# Patient Record
Sex: Female | Born: 1940 | Race: White | Hispanic: No | Marital: Married | Smoking: Never smoker
Health system: Southern US, Community
[De-identification: ages and names within clinical notes are randomized; demographics above are authoritative.]

## PROBLEM LIST (undated history)

## (undated) DIAGNOSIS — M810 Age-related osteoporosis without current pathological fracture: Secondary | ICD-10-CM

## (undated) HISTORY — PX: HAND SURGERY: SHX662

---

## 1988-06-23 HISTORY — PX: SHOULDER SURGERY: SHX246

## 2005-12-25 ENCOUNTER — Emergency Department (HOSPITAL_COMMUNITY): Admission: EM | Admit: 2005-12-25 | Discharge: 2005-12-25 | Payer: Self-pay | Admitting: Emergency Medicine

## 2014-06-23 HISTORY — PX: OVARIAN CYST REMOVAL: SHX89

## 2015-01-30 ENCOUNTER — Encounter (HOSPITAL_COMMUNITY): Payer: Self-pay | Admitting: Emergency Medicine

## 2015-01-30 ENCOUNTER — Emergency Department (HOSPITAL_COMMUNITY): Payer: Medicare Other

## 2015-01-30 ENCOUNTER — Emergency Department (HOSPITAL_COMMUNITY)
Admission: EM | Admit: 2015-01-30 | Discharge: 2015-01-30 | Disposition: A | Discharge status: Home / Self Care | Payer: Medicare Other | Attending: Emergency Medicine | Admitting: Emergency Medicine

## 2015-01-30 DIAGNOSIS — X58XXXA Exposure to other specified factors, initial encounter: Secondary | ICD-10-CM | POA: Diagnosis not present

## 2015-01-30 DIAGNOSIS — Y9389 Activity, other specified: Secondary | ICD-10-CM | POA: Diagnosis not present

## 2015-01-30 DIAGNOSIS — Y998 Other external cause status: Secondary | ICD-10-CM | POA: Diagnosis not present

## 2015-01-30 DIAGNOSIS — Y9289 Other specified places as the place of occurrence of the external cause: Secondary | ICD-10-CM | POA: Insufficient documentation

## 2015-01-30 DIAGNOSIS — Z8739 Personal history of other diseases of the musculoskeletal system and connective tissue: Secondary | ICD-10-CM | POA: Diagnosis not present

## 2015-01-30 DIAGNOSIS — S20212A Contusion of left front wall of thorax, initial encounter: Secondary | ICD-10-CM | POA: Diagnosis not present

## 2015-01-30 DIAGNOSIS — S299XXA Unspecified injury of thorax, initial encounter: Secondary | ICD-10-CM | POA: Diagnosis present

## 2015-01-30 HISTORY — DX: Age-related osteoporosis without current pathological fracture: M81.0

## 2015-01-30 NOTE — Discharge Instructions (Signed)
Take ibuprofen or Aleve for pain control. You may also apply ice to areas of injury. Your chest x-ray did not show any broken bone or fracture. Follow-up with your primary care doctor as needed if symptoms persist.  Chest Contusion A chest contusion is a deep bruise on your chest area. Contusions are the result of an injury that caused bleeding under the skin. A chest contusion may involve bruising of the skin, muscles, or ribs. The contusion may turn blue, purple, or yellow. Minor injuries will give you a painless contusion, but more severe contusions may stay painful and swollen for a few weeks. CAUSES  A contusion is usually caused by a blow, trauma, or direct force to an area of the body. SYMPTOMS   Swelling and redness of the injured area.  Discoloration of the injured area.  Tenderness and soreness of the injured area.  Pain. DIAGNOSIS  The diagnosis can be made by taking a history and performing a physical exam. An X-ray, CT scan, or MRI may be needed to determine if there were any associated injuries, such as broken bones (fractures) or internal injuries. TREATMENT  Often, the best treatment for a chest contusion is resting, icing, and applying cold compresses to the injured area. Deep breathing exercises may be recommended to reduce the risk of pneumonia. Over-the-counter medicines may also be recommended for pain control. HOME CARE INSTRUCTIONS   Put ice on the injured area.  Put ice in a plastic bag.  Place a towel between your skin and the bag.  Leave the ice on for 15-20 minutes, 03-04 times a day.  Only take over-the-counter or prescription medicines as directed by your caregiver. Your caregiver may recommend avoiding anti-inflammatory medicines (aspirin, ibuprofen, and naproxen) for 48 hours because these medicines may increase bruising.  Rest the injured area.  Perform deep-breathing exercises as directed by your caregiver.  Stop smoking if you smoke.  Do not lift  objects over 5 pounds (2.3 kg) for 3 days or longer if recommended by your caregiver. SEEK IMMEDIATE MEDICAL CARE IF:   You have increased bruising or swelling.  You have pain that is getting worse.  You have difficulty breathing.  You have dizziness, weakness, or fainting.  You have blood in your urine or stool.  You cough up or vomit blood.  Your swelling or pain is not relieved with medicines. MAKE SURE YOU:   Understand these instructions.  Will watch your condition.  Will get help right away if you are not doing well or get worse. Document Released: 03/04/2001 Document Revised: 03/03/2012 Document Reviewed: 12/01/2011 [facility] Patient Information 2015 [place], [place]. This information is not intended to replace advice given to you by your health care provider. Make sure you discuss any questions you have with your health care provider.

## 2015-01-30 NOTE — ED Notes (Signed)
Pt c/o left rib pain. She was on top of the washing machine trying to turn off the water, and her side kept pushing against the washer, has been in pain since. Pt has hx of osteoporosis. Pt visiting from [place].

## 2015-01-30 NOTE — ED Provider Notes (Signed)
  Face-to-face evaluation   History: Fall, 2 days ago. Persistent but improving pain, left anterior chest wall.  Physical exam: Alert, calm, cooperative. No respiratory distress. Normal range of motion, Chest and back.  Medical screening examination/treatment/procedure(s) were conducted as a shared visit with non-physician practitioner(s) and myself.  I personally evaluated the patient during the encounter  Mancel Bale, MD 01/31/15 1242

## 2015-01-30 NOTE — ED Provider Notes (Signed)
CSN: [id number]     Arrival date & time 01/30/15  1744 History   First MD Initiated Contact with Patient 01/30/15 1831     Chief Complaint  Patient presents with  . Rib Injury    (Consider location/radiation/quality/duration/timing/severity/associated sxs/prior Treatment) Patient is a 74 y.o. female presenting with chest pain. The history is provided by the patient. No language interpreter was used.  Chest Pain Pain location:  L lateral chest Pain quality: sharp   Pain radiates to:  Does not radiate Pain radiates to the back: no   Pain severity:  Mild Onset quality:  Sudden Duration:  2 days Timing:  Intermittent Progression:  Waxing and waning Chronicity:  New Context: trauma   Context comment:  Patient on top of a washing machine yesterday; reaching over to try and turn on a water valve Relieved by:  Nothing Worsened by:  Coughing, movement and deep breathing Ineffective treatments:  None tried Associated symptoms: no back pain, no dizziness, no shortness of breath, no syncope and no weakness   Risk factors comment:  +osteoporosis   Past Medical History  Diagnosis Date  . Osteoporosis    Past Surgical History  Procedure Laterality Date  . Hand surgery    . Shoulder surgery  1990  . Ovarian cyst removal  2016   No family history on file. History  Substance Use Topics  . Smoking status: Never Smoker   . Smokeless tobacco: Not on file  . Alcohol Use: No   OB History    No data available      Review of Systems  Respiratory: Negative for shortness of breath.   Cardiovascular: Positive for chest pain. Negative for syncope.  Musculoskeletal: Negative for back pain.  Neurological: Negative for dizziness and weakness.  All other systems reviewed and are negative.   Allergies  Review of patient's allergies indicates no known allergies.  Home Medications   Prior to Admission medications   Not on File   BP 143/62 mmHg  Pulse 82  Temp(Src) 97.9 F (36.6 C)  (Oral)  Resp 18  SpO2 98%   Physical Exam  Constitutional: She is oriented to person, place, and time. She appears well-developed and well-nourished. No distress.  Nontoxic/nonseptic appearing. Patient pleasant.  HENT:  Head: Normocephalic and atraumatic.  Eyes: Conjunctivae and EOM are normal. No scleral icterus.  Neck: Normal range of motion.  Cardiovascular: Normal rate, regular rhythm and intact distal pulses.   Pulmonary/Chest: Effort normal. No respiratory distress. She has no wheezes. She has no rales. She exhibits tenderness.  Tenderness to palpation to the left lateral chest wall, along the left anterior axillary line. No crepitus or deformity. Lungs clear to auscultation bilaterally.  Musculoskeletal: Normal range of motion.  Neurological: She is alert and oriented to person, place, and time. She exhibits normal muscle tone. Coordination normal.  Skin: Skin is warm and dry. No rash noted. She is not diaphoretic. No erythema. No pallor.  Psychiatric: She has a normal mood and affect. Her behavior is normal.  Nursing note and vitals reviewed.   ED Course  Procedures (including critical care time) Labs Review Labs Reviewed - No data to display  Imaging Review Dg Ribs Unilateral W/chest Left  01/30/2015   CLINICAL DATA:  Left rib pain  EXAM: LEFT RIBS AND CHEST - 3+ VIEW  COMPARISON:  12/25/2005  FINDINGS: Five views left ribs submitted. No acute infiltrate or pulmonary edema. No left rib fracture. No pneumothorax.  IMPRESSION: Negative.   Electronically Signed  By: Natasha Mead M.D.   On: 01/30/2015 19:20     EKG Interpretation None      MDM   Final diagnoses:  Chest wall contusion, left, initial encounter    74 year old female with symptoms consistent with a contusion to the left lateral chest wall. X-ray negative for rib fracture or pneumothorax. Pain is reproducible on palpation. No crepitus or deformity noted. Will manage with ice and NSAIDs. Patient advised to  follow-up with her primary care doctor should symptoms persist. Return precautions discussed and provided. Patient discharged in good condition with no unaddressed concerns.   Filed Vitals:   01/30/15 1751 01/30/15 1803  BP: 143/62   Pulse: 82   Temp:  97.9 F (36.6 C)  TempSrc:  Oral  Resp: 18   SpO2: 98%      Antony Madura, PA-C 01/30/15 1936  Mancel Bale, MD 01/31/15 1242

## 2017-02-22 IMAGING — CR DG RIBS W/ CHEST 3+V*L*
5 series · 5 of 5 positions shown · non-contrast
Comparison: 12/25/2005

CLINICAL DATA: Left rib pain

EXAM:
LEFT RIBS AND CHEST - 3+ VIEW

[w chest pa]
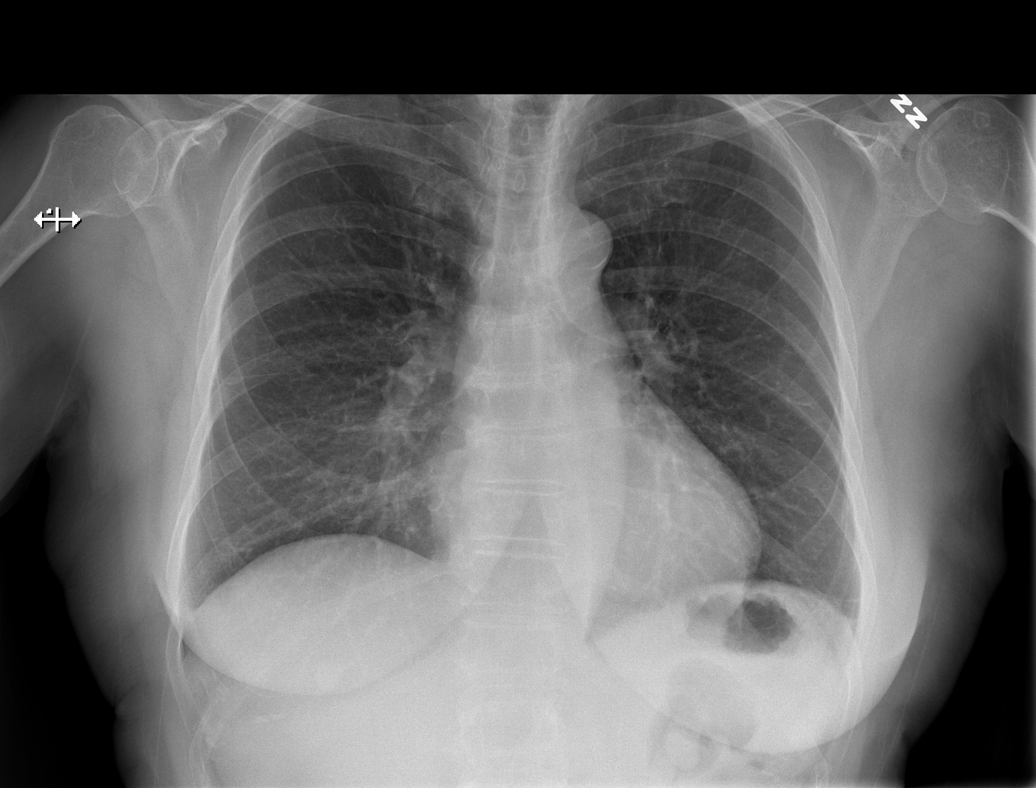

[w ribs ap upper left]
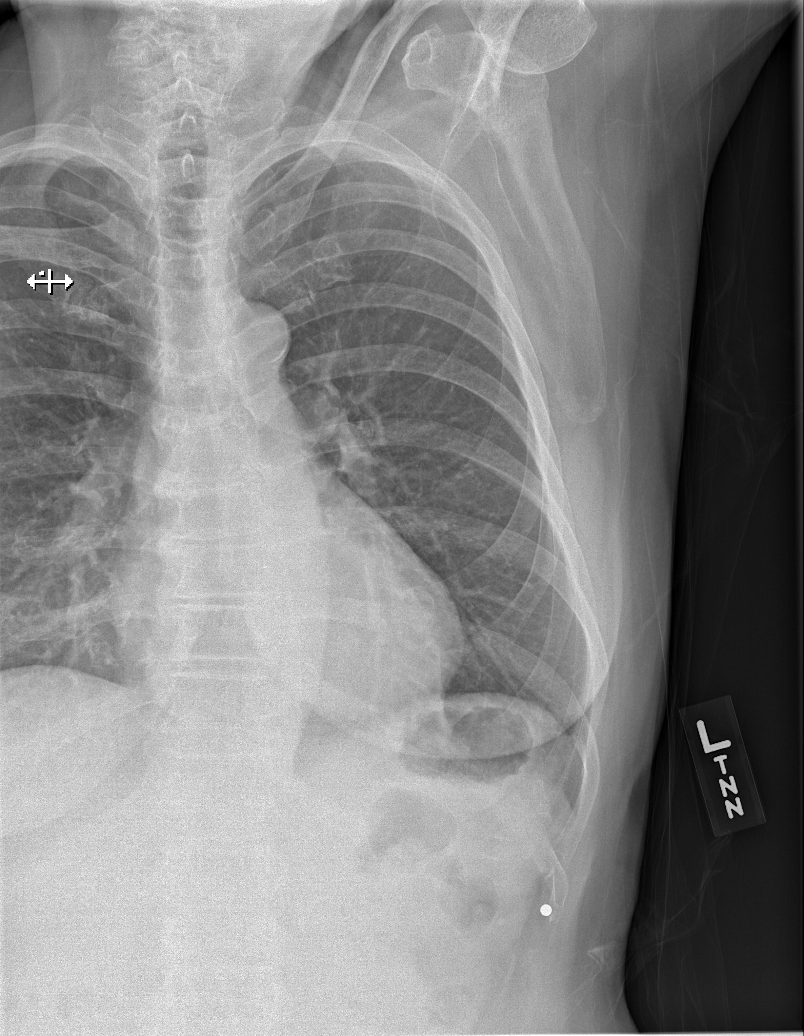

[w ribs ap lower left]
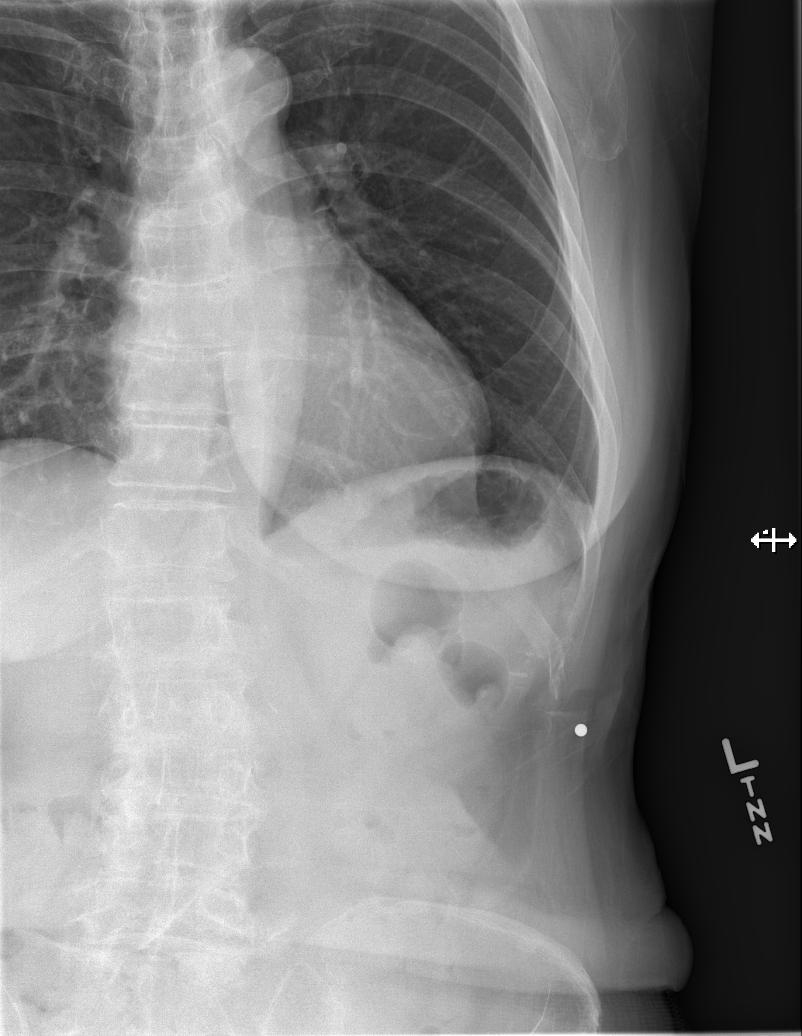

[w ribs obl left (1 of 2)]
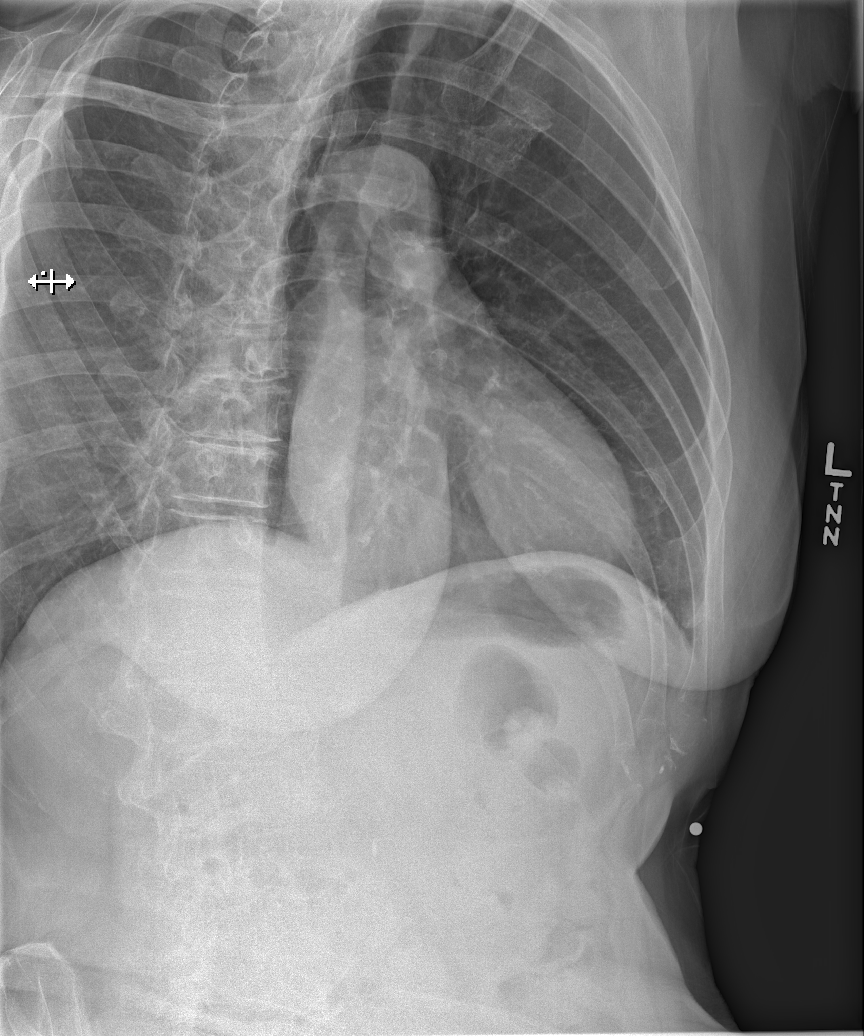

[w ribs obl left (2 of 2)]
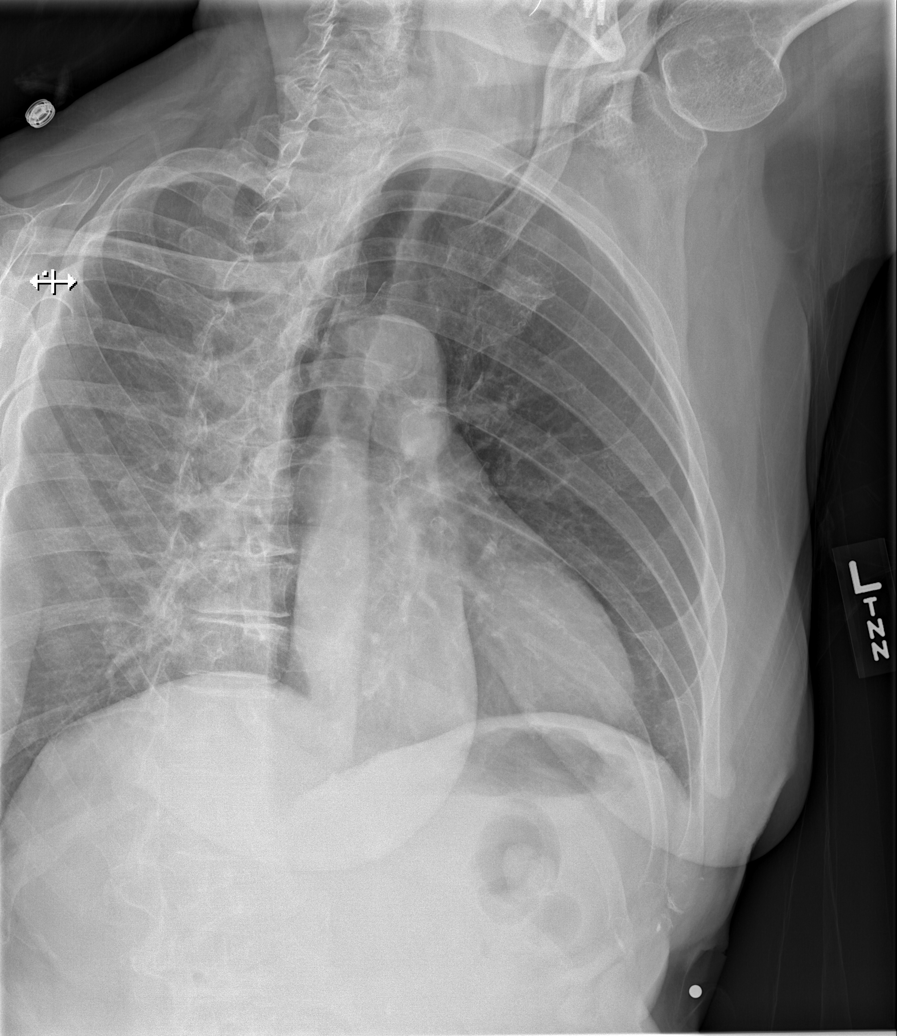

[5 of 5 positions shown; findings below may reference images not displayed]

FINDINGS: Five views left ribs submitted. No acute infiltrate or pulmonary
edema. No left rib fracture. No pneumothorax.
IMPRESSION: Negative.
